# Patient Record
Sex: Female | Born: 1970 | Race: White | Hispanic: No | Marital: Married | Smoking: Current every day smoker
Health system: Southern US, Community
[De-identification: ages and names within clinical notes are randomized; demographics above are authoritative.]

## PROBLEM LIST (undated history)

## (undated) DIAGNOSIS — F419 Anxiety disorder, unspecified: Secondary | ICD-10-CM

## (undated) HISTORY — DX: Anxiety disorder, unspecified: F41.9

## (undated) HISTORY — PX: EUSTACHIAN TUBE DILATION: SHX6770

---

## 1997-12-18 ENCOUNTER — Emergency Department (HOSPITAL_COMMUNITY): Admission: EM | Admit: 1997-12-18 | Discharge: 1997-12-18 | Payer: Self-pay | Admitting: Emergency Medicine

## 1999-01-01 ENCOUNTER — Emergency Department (HOSPITAL_COMMUNITY): Admission: EM | Admit: 1999-01-01 | Discharge: 1999-01-01 | Payer: Self-pay | Admitting: Emergency Medicine

## 1999-01-02 ENCOUNTER — Encounter: Payer: Self-pay | Admitting: Emergency Medicine

## 1999-01-07 ENCOUNTER — Inpatient Hospital Stay (HOSPITAL_COMMUNITY): Admission: AD | Admit: 1999-01-07 | Discharge: 1999-01-07 | Payer: Self-pay | Admitting: Obstetrics

## 1999-01-27 ENCOUNTER — Encounter: Admission: RE | Admit: 1999-01-27 | Discharge: 1999-01-27 | Payer: Self-pay | Admitting: Obstetrics & Gynecology

## 1999-02-10 ENCOUNTER — Encounter: Admission: RE | Admit: 1999-02-10 | Discharge: 1999-02-10 | Payer: Self-pay | Admitting: Obstetrics & Gynecology

## 2002-02-15 ENCOUNTER — Emergency Department (HOSPITAL_COMMUNITY): Admission: EM | Admit: 2002-02-15 | Discharge: 2002-02-15 | Payer: Self-pay | Admitting: Emergency Medicine

## 2002-02-16 ENCOUNTER — Ambulatory Visit (HOSPITAL_COMMUNITY): Admission: RE | Admit: 2002-02-16 | Discharge: 2002-02-16 | Payer: Self-pay | Admitting: Emergency Medicine

## 2002-02-16 ENCOUNTER — Encounter: Payer: Self-pay | Admitting: Emergency Medicine

## 2006-12-26 ENCOUNTER — Encounter: Admission: RE | Admit: 2006-12-26 | Discharge: 2006-12-26 | Payer: Self-pay | Admitting: Family Medicine

## 2010-12-10 ENCOUNTER — Other Ambulatory Visit: Payer: Self-pay | Admitting: Hematology & Oncology

## 2011-02-06 ENCOUNTER — Emergency Department: Payer: Self-pay | Admitting: Emergency Medicine

## 2011-09-29 ENCOUNTER — Telehealth: Payer: Self-pay | Admitting: Family Medicine

## 2011-09-29 NOTE — Telephone Encounter (Signed)
Husband was advised by someone at [facility] office to call for an acute appointment with [facility] today.  Husband described that wife has had symptoms of nausea/vomiting and diarrhea for several days.  Husband stated patient does not have an established PCP and has not been to a doctor in years.  He said that she has been to emergency room in the past when she needed care.  After discussing with one of the doctors at [facility] and a CMA to triage, the patient's husband was advised by the CMA that in order to address his wife's urgent needs that he would need to take her to an urgent care or ER in order for them to assess her in case she is dehydrated and needs I.V. Fluids or other urgent treatment.  The CMA also explained that we would be glad to establish as a new patient and schedule an appointment for her to establish a PCP at this office.  The husband declined and said he would call [facility] back.

## 2011-09-29 NOTE — Telephone Encounter (Signed)
Discussed with Mr. Pietrzak as previously noted by Clarisa Schools.

## 2014-07-19 ENCOUNTER — Emergency Department: Payer: Self-pay | Admitting: Emergency Medicine

## 2014-07-19 LAB — URINALYSIS, COMPLETE
Bilirubin,UR: NEGATIVE
Glucose,UR: NEGATIVE mg/dL (ref 0–75)
Ketone: NEGATIVE
Leukocyte Esterase: NEGATIVE
NITRITE: NEGATIVE
PH: 5 (ref 4.5–8.0)
Protein: 30
RBC,UR: 365 /HPF (ref 0–5)
SPECIFIC GRAVITY: 1.024 (ref 1.003–1.030)
Squamous Epithelial: 6
WBC UR: 1 /HPF (ref 0–5)

## 2014-07-19 LAB — COMPREHENSIVE METABOLIC PANEL
ALK PHOS: 51 U/L
ALT: 16 U/L
ANION GAP: 5 — AB (ref 7–16)
Albumin: 3.6 g/dL (ref 3.4–5.0)
BUN: 14 mg/dL (ref 7–18)
Bilirubin,Total: 0.3 mg/dL (ref 0.2–1.0)
CREATININE: 0.83 mg/dL (ref 0.60–1.30)
Calcium, Total: 8.3 mg/dL — ABNORMAL LOW (ref 8.5–10.1)
Chloride: 110 mmol/L — ABNORMAL HIGH (ref 98–107)
Co2: 23 mmol/L (ref 21–32)
Glucose: 140 mg/dL — ABNORMAL HIGH (ref 65–99)
Osmolality: 278 (ref 275–301)
Potassium: 4.1 mmol/L (ref 3.5–5.1)
SGOT(AST): 14 U/L — ABNORMAL LOW (ref 15–37)
Sodium: 138 mmol/L (ref 136–145)
TOTAL PROTEIN: 6.6 g/dL (ref 6.4–8.2)

## 2014-07-19 LAB — CBC
HCT: 39.1 % (ref 35.0–47.0)
HGB: 12.6 g/dL (ref 12.0–16.0)
MCH: 29.4 pg (ref 26.0–34.0)
MCHC: 32.2 g/dL (ref 32.0–36.0)
MCV: 91 fL (ref 80–100)
Platelet: 164 10*3/uL (ref 150–440)
RBC: 4.27 10*6/uL (ref 3.80–5.20)
RDW: 14.1 % (ref 11.5–14.5)
WBC: 11.4 10*3/uL — ABNORMAL HIGH (ref 3.6–11.0)

## 2014-07-19 LAB — PREGNANCY, URINE: Pregnancy Test, Urine: NEGATIVE m[IU]/mL

## 2014-07-25 ENCOUNTER — Emergency Department: Payer: Self-pay | Admitting: Emergency Medicine

## 2014-07-25 LAB — URINALYSIS, COMPLETE
BILIRUBIN, UR: NEGATIVE
BLOOD: NEGATIVE
Bacteria: NONE SEEN
Glucose,UR: NEGATIVE mg/dL (ref 0–75)
KETONE: NEGATIVE
Leukocyte Esterase: NEGATIVE
Nitrite: NEGATIVE
PH: 7 (ref 4.5–8.0)
Protein: NEGATIVE
Specific Gravity: 1.009 (ref 1.003–1.030)
WBC UR: 3 /HPF (ref 0–5)

## 2014-12-08 ENCOUNTER — Emergency Department
Admission: EM | Admit: 2014-12-08 | Discharge: 2014-12-08 | Payer: Medicaid Other | Attending: Emergency Medicine | Admitting: Emergency Medicine

## 2014-12-08 ENCOUNTER — Encounter: Payer: Self-pay | Admitting: Emergency Medicine

## 2014-12-08 DIAGNOSIS — Z720 Tobacco use: Secondary | ICD-10-CM | POA: Diagnosis not present

## 2014-12-08 DIAGNOSIS — T22131A Burn of first degree of right upper arm, initial encounter: Secondary | ICD-10-CM | POA: Insufficient documentation

## 2014-12-08 DIAGNOSIS — T2016XA Burn of first degree of forehead and cheek, initial encounter: Secondary | ICD-10-CM | POA: Insufficient documentation

## 2014-12-08 DIAGNOSIS — T24122A Burn of first degree of left knee, initial encounter: Secondary | ICD-10-CM | POA: Insufficient documentation

## 2014-12-08 DIAGNOSIS — Y998 Other external cause status: Secondary | ICD-10-CM | POA: Insufficient documentation

## 2014-12-08 DIAGNOSIS — T22031A Burn of unspecified degree of right upper arm, initial encounter: Secondary | ICD-10-CM | POA: Diagnosis present

## 2014-12-08 DIAGNOSIS — Z3202 Encounter for pregnancy test, result negative: Secondary | ICD-10-CM | POA: Diagnosis not present

## 2014-12-08 DIAGNOSIS — T2602XA Burn of left eyelid and periocular area, initial encounter: Secondary | ICD-10-CM | POA: Insufficient documentation

## 2014-12-08 DIAGNOSIS — T23232A Burn of second degree of multiple left fingers (nail), not including thumb, initial encounter: Secondary | ICD-10-CM | POA: Diagnosis not present

## 2014-12-08 DIAGNOSIS — T300 Burn of unspecified body region, unspecified degree: Secondary | ICD-10-CM

## 2014-12-08 DIAGNOSIS — Y9289 Other specified places as the place of occurrence of the external cause: Secondary | ICD-10-CM | POA: Diagnosis not present

## 2014-12-08 DIAGNOSIS — Z23 Encounter for immunization: Secondary | ICD-10-CM | POA: Insufficient documentation

## 2014-12-08 DIAGNOSIS — X088XXA Exposure to other specified smoke, fire and flames, initial encounter: Secondary | ICD-10-CM | POA: Diagnosis not present

## 2014-12-08 DIAGNOSIS — Y9389 Activity, other specified: Secondary | ICD-10-CM | POA: Diagnosis not present

## 2014-12-08 LAB — CBC
HEMATOCRIT: 42.3 % (ref 35.0–47.0)
Hemoglobin: 13.9 g/dL (ref 12.0–16.0)
MCH: 29.6 pg (ref 26.0–34.0)
MCHC: 32.8 g/dL (ref 32.0–36.0)
MCV: 90.1 fL (ref 80.0–100.0)
PLATELETS: 226 10*3/uL (ref 150–440)
RBC: 4.69 MIL/uL (ref 3.80–5.20)
RDW: 13.8 % (ref 11.5–14.5)
WBC: 10.1 10*3/uL (ref 3.6–11.0)

## 2014-12-08 LAB — BASIC METABOLIC PANEL
Anion gap: 8 (ref 5–15)
BUN: 10 mg/dL (ref 6–20)
CO2: 25 mmol/L (ref 22–32)
CREATININE: 0.76 mg/dL (ref 0.44–1.00)
Calcium: 9.4 mg/dL (ref 8.9–10.3)
Chloride: 105 mmol/L (ref 101–111)
Glucose, Bld: 110 mg/dL — ABNORMAL HIGH (ref 65–99)
POTASSIUM: 4.2 mmol/L (ref 3.5–5.1)
SODIUM: 138 mmol/L (ref 135–145)

## 2014-12-08 LAB — POCT PREGNANCY, URINE: PREG TEST UR: NEGATIVE

## 2014-12-08 MED ORDER — HYDROMORPHONE HCL 1 MG/ML IJ SOLN
INTRAMUSCULAR | Status: AC
  Filled 2014-12-08: qty 1

## 2014-12-08 MED ORDER — HYDROMORPHONE HCL 1 MG/ML IJ SOLN
1.0000 mg | INTRAMUSCULAR | Status: AC
  Administered 2014-12-08: 1 mg via INTRAVENOUS

## 2014-12-08 MED ORDER — TETANUS-DIPHTH-ACELL PERTUSSIS 5-2.5-18.5 LF-MCG/0.5 IM SUSP
0.5000 mL | Freq: Once | INTRAMUSCULAR | Status: AC
  Administered 2014-12-08: 0.5 mL via INTRAMUSCULAR

## 2014-12-08 MED ORDER — MORPHINE SULFATE 4 MG/ML IJ SOLN
INTRAMUSCULAR | Status: AC
  Filled 2014-12-08: qty 1

## 2014-12-08 MED ORDER — MORPHINE SULFATE 4 MG/ML IJ SOLN: 4.0000 mg | Freq: Once | INTRAMUSCULAR | Status: DC

## 2014-12-08 MED ORDER — ONDANSETRON HCL 4 MG/2ML IJ SOLN
4.0000 mg | Freq: Once | INTRAMUSCULAR | Status: AC
  Administered 2014-12-08: 4 mg via INTRAVENOUS

## 2014-12-08 MED ORDER — TETANUS-DIPHTH-ACELL PERTUSSIS 5-2.5-18.5 LF-MCG/0.5 IM SUSP
INTRAMUSCULAR | Status: AC
  Administered 2014-12-08: 0.5 mL via INTRAMUSCULAR
  Filled 2014-12-08: qty 0.5

## 2014-12-08 MED ORDER — SODIUM CHLORIDE 0.9 % IV BOLUS (SEPSIS)
1000.0000 mL | Freq: Once | INTRAVENOUS | Status: AC
  Administered 2014-12-08: 1000 mL via INTRAVENOUS

## 2014-12-08 MED ORDER — ONDANSETRON HCL 4 MG/2ML IJ SOLN
INTRAMUSCULAR | Status: AC
  Administered 2014-12-08: 4 mg via INTRAVENOUS
  Filled 2014-12-08: qty 2

## 2014-12-08 MED ORDER — HYDROMORPHONE HCL 1 MG/ML IJ SOLN
1.0000 mg | INTRAMUSCULAR | Status: DC | PRN
  Administered 2014-12-08: 1 mg via INTRAVENOUS

## 2014-12-08 MED ORDER — HYDROMORPHONE HCL 1 MG/ML IJ SOLN
INTRAMUSCULAR | Status: AC
  Administered 2014-12-08: 1 mg via INTRAVENOUS
  Filled 2014-12-08: qty 1
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
## 2014-12-08 NOTE — ED Notes (Signed)
Per EMS, delay in pt transfer, MD aware and approve.

## 2015-03-14 ENCOUNTER — Ambulatory Visit: Payer: Self-pay | Admitting: General Surgery

## 2015-03-24 ENCOUNTER — Ambulatory Visit: Payer: Self-pay | Admitting: General Surgery

## 2016-06-30 IMAGING — CT CT ABD-PELV W/O CM
1 of 4 series · 4 of 46 positions shown, 9 images · non-contrast
Comparison: None.

CLINICAL DATA: Bilateral flank pain.  Pain with urination.

EXAM:
CT ABDOMEN AND PELVIS WITHOUT CONTRAST
TECHNIQUE: Multidetector CT imaging of the abdomen and pelvis was performed
following the standard protocol without IV contrast.

[Series 4: lung windows · axial · 0.58mm/px · z∈[-733,-678]mm · 4 of 19 slices shown, 9 images]
[im 4/19  soft-tissue]
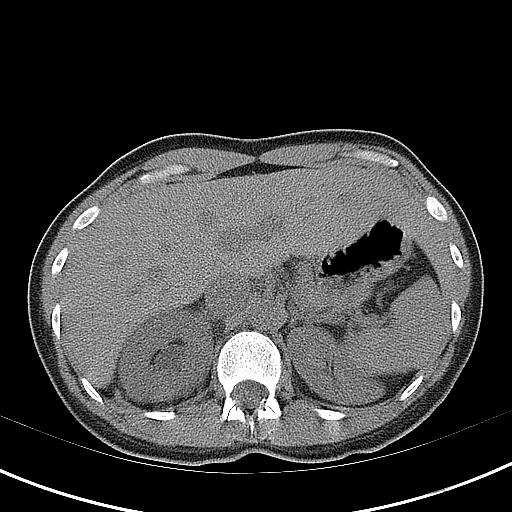
[im 4/19  lung]
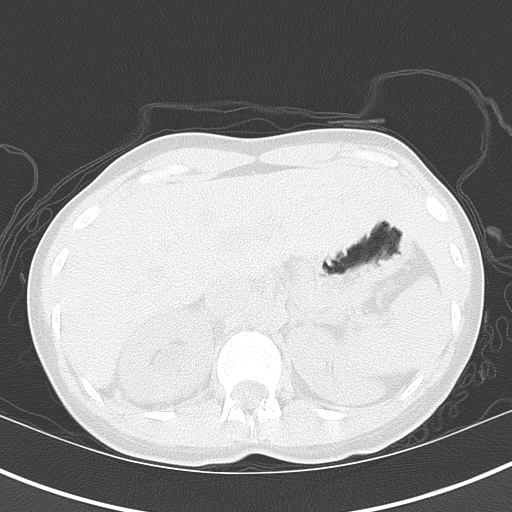
[im 4/19  bone]
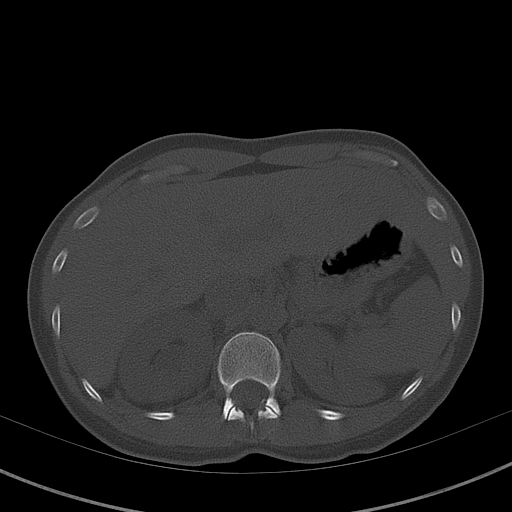
[im 8/19  soft-tissue]
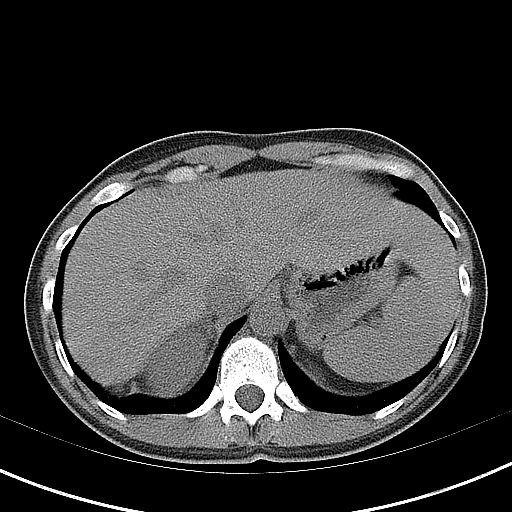
[im 8/19  lung]
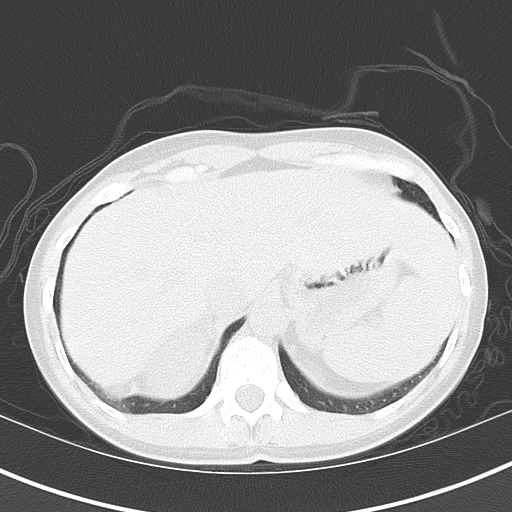
[im 11/19  soft-tissue]
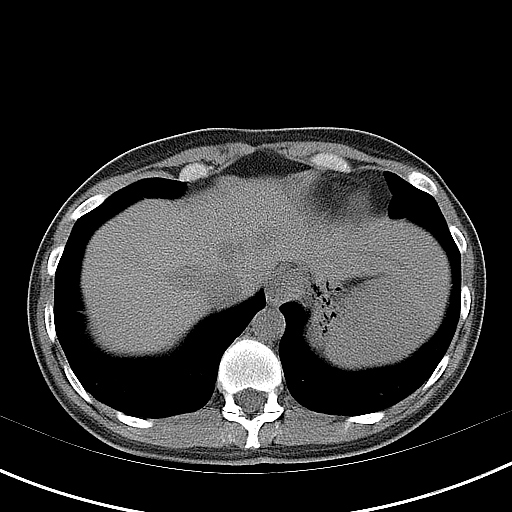
[im 11/19  lung]
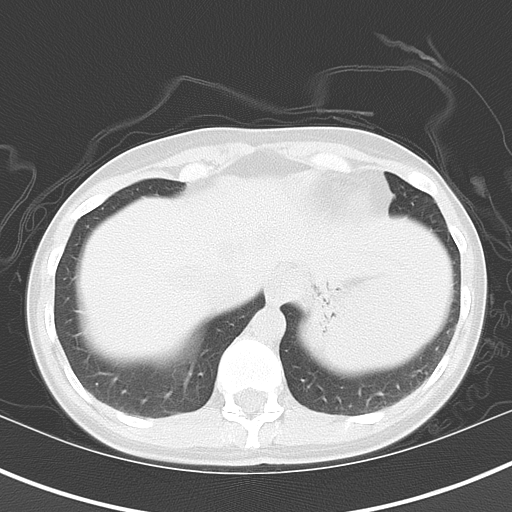
[im 15/19  soft-tissue]
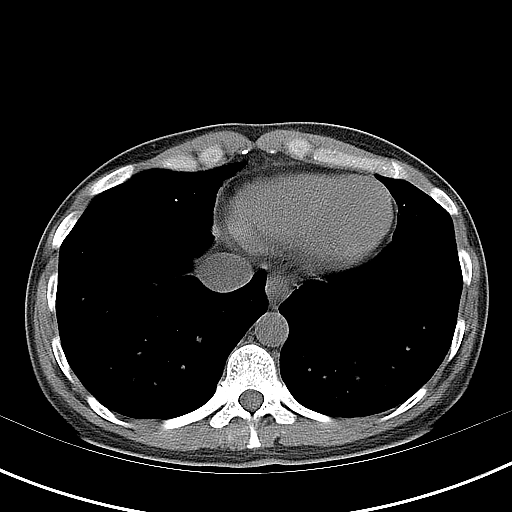
[im 15/19  lung]
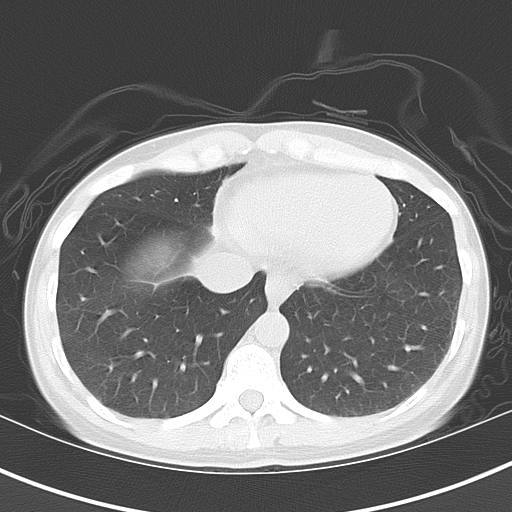

[4 of 46 positions shown; findings below may reference images not displayed]

FINDINGS: The lung bases are clear.

There is are punctate nonobstructing right renal calculi. There is a
2-3 mm distal right ureteral calculus resulting in moderate right
hydroureteronephrosis. No perinephric stranding is seen. The kidneys
are symmetric in size without evidence for exophytic mass. The
bladder is unremarkable.

The liver demonstrates no focal abnormality. The gallbladder is
unremarkable. The spleen demonstrates no focal abnormality. The
adrenal glands and pancreas are normal.

The unopacified stomach, duodenum, small intestine and large
intestine are unremarkable, but evaluation is limited by lack of
oral contrast. There is a normal caliber appendix in the right lower
quadrant without periappendiceal inflammatory changes. There is no
pneumoperitoneum, pneumatosis, or portal venous gas. There is no
abdominal or pelvic free fluid. There is no lymphadenopathy.

The abdominal aorta is normal in caliber .

The osseous structures are unremarkable.
IMPRESSION: 1. 2-3 mm distal right ureteral calculus, just proximal to the UVJ,
resulting in moderate right hydroureteronephrosis.

## 2016-10-25 ENCOUNTER — Emergency Department
Admission: EM | Admit: 2016-10-25 | Discharge: 2016-10-25 | Disposition: A | Discharge status: Home / Self Care | Payer: Self-pay | Attending: Emergency Medicine | Admitting: Emergency Medicine

## 2016-10-25 ENCOUNTER — Encounter: Payer: Self-pay | Admitting: *Deleted

## 2016-10-25 DIAGNOSIS — H73011 Bullous myringitis, right ear: Secondary | ICD-10-CM | POA: Insufficient documentation

## 2016-10-25 DIAGNOSIS — H60501 Unspecified acute noninfective otitis externa, right ear: Secondary | ICD-10-CM | POA: Insufficient documentation

## 2016-10-25 DIAGNOSIS — F17200 Nicotine dependence, unspecified, uncomplicated: Secondary | ICD-10-CM | POA: Insufficient documentation

## 2016-10-25 MED ORDER — OFLOXACIN 0.3 % OP SOLN: 1.0000 [drp] | Freq: Four times a day (QID) | OPHTHALMIC | 0 refills | Status: DC

## 2016-10-25 MED ORDER — AMOXICILLIN 500 MG PO CAPS
500.0000 mg | ORAL_CAPSULE | Freq: Once | ORAL | Status: AC
  Administered 2016-10-25: 500 mg via ORAL
  Filled 2016-10-25: qty 1

## 2016-10-25 MED ORDER — AMOXICILLIN 500 MG PO CAPS: 500.0000 mg | ORAL_CAPSULE | Freq: Three times a day (TID) | ORAL | 0 refills | Status: DC

## 2016-10-25 NOTE — Discharge Instructions (Signed)
Take the prescriptions as directed. Follow-up with Dr. Elenore Rota if symptoms continue. Avoid getting any water in your ear.

## 2016-10-25 NOTE — ED Triage Notes (Signed)
States bilateral ear fullness and drainage for 1 week, pt has been taking advil OTC with no relief

## 2016-10-30 NOTE — ED Provider Notes (Signed)
[facility] Emergency Department Provider Note ____________________________________________  Time seen: 1645  I have reviewed the triage vital signs and the nursing notes.  HISTORY  Chief Complaint  Otalgia  HPI Melanie Larsen is a 46 y.o. female Presents to the ED for evaluation of bilateral ear fullness and drainage for the past week. Patient has been dosing the counter Tylenol and Advil without significant relief. She denies any interim fevers, chills, sweats. She also denies any dizziness, tinnitus, or vertigo. She does note some decreased hearingto the left ear. She describes some purulent drainage from both ears, but noticed some blood-tinged drainage on the left.  History reviewed. No pertinent past medical history.  There are no active problems to display for this patient.  History reviewed. No pertinent surgical history.  Prior to Admission medications   Medication Sig Start Date End Date Taking? Authorizing Provider  amoxicillin (AMOXIL) 500 MG capsule Take 1 capsule (500 mg total) by mouth 3 (three) times daily. 10/25/16   Brian Zeitlin V Bacon Donnae Michels, PA-C  ofloxacin (OCUFLOX) 0.3 % ophthalmic solution Place 1 drop into both ears 4 (four) times daily. 10/25/16   Sunshyne Horvath V Bacon Tymira Horkey, PA-C    Allergies Codeine  History reviewed. No pertinent family history.  Social History Social History  Substance Use Topics  . Smoking status: Current Every Day Smoker  . Smokeless tobacco: Not on file  . Alcohol use No    Review of Systems  Constitutional: Negative for fever. Eyes: Negative for visual changes. ENT: Negative for sore throat. Left greater than right otalgia as above. Cardiovascular: Negative for chest pain. Respiratory: Negative for shortness of breath. Gastrointestinal: Negative for abdominal pain, vomiting and diarrhea. Neurological: Negative for headaches, focal weakness or  numbness. ____________________________________________  PHYSICAL EXAM:  VITAL SIGNS: ED Triage Vitals  Enc Vitals Group     BP 10/25/16 1603 125/67     Pulse Rate 10/25/16 1603 94     Resp 10/25/16 1603 18     Temp 10/25/16 1603 97.8 F (36.6 C)     Temp Source 10/25/16 1603 Oral     SpO2 10/25/16 1603 98 %     Weight 10/25/16 1602 116 lb (52.6 kg)     Height 10/25/16 1602  (1.626 m)     Head Circumference --      Peak Flow --      Pain Score 10/25/16 1601 10     Pain Loc --      Pain Edu? --      Excl. in GC? --     Constitutional: Alert and oriented. Well appearing and in no distress. Head: Normocephalic and atraumatic. Eyes: Conjunctivae are normal. PERRL. Normal extraocular movements Ears: Canals with some purulent drainage bilaterally. TMs intact bilaterally. Left TM with bullae noted and a seropurulent effusion. Right TM poorly visualized due to cerumen in the canal.  Nose: No congestion/rhinorrhea/epistaxis. Mouth/Throat: Mucous membranes are moist. Neck: Supple. No thyromegaly. Hematological/Lymphatic/Immunological: No preauricular lymphadenopathy. Cardiovascular: Normal rate, regular rhythm. Normal distal pulses. Respiratory: Normal respiratory effort. No wheezes/rales/rhonchi. Neurologic:  Normal gait without ataxia. Normal speech and language. No gross focal neurologic deficits are appreciated. Skin:  Skin is warm, dry and intact. No rash noted. Psychiatric: Mood and affect are normal. Patient exhibits appropriate insight and judgment. ____________________________________________  PROCEDURES  Amoxicillin 500 mg PO ____________________________________________  INITIAL IMPRESSION / ASSESSMENT AND PLAN / ED COURSE  Patient with what appears to be in acute otitis externa in the right ear as  well as a bolus pharyngitis of the right ear. Her left ear may also show some maceration the canal which might represent an early otitis externa. She is discharged with  prescriptions for amoxicillin as well as ofloxacin ophthalmic to instill in the ears. She will follow-up with [facility] on the local community clinics for ongoing symptom management. Return precautions are reviewed. ____________________________________________  FINAL CLINICAL IMPRESSION(S) / ED DIAGNOSES  Final diagnoses:  Acute otitis externa of right ear, unspecified type  Bullous myringitis of right ear      Lissa Hoard, PA-C 10/30/16 1744    Phineas Semen, MD 11/01/16 [id number]

## 2021-04-14 ENCOUNTER — Encounter: Payer: Self-pay | Admitting: Certified Nurse Midwife

## 2021-04-14 ENCOUNTER — Ambulatory Visit (INDEPENDENT_AMBULATORY_CARE_PROVIDER_SITE_OTHER): Payer: Self-pay | Admitting: Certified Nurse Midwife

## 2021-04-14 ENCOUNTER — Other Ambulatory Visit: Payer: Self-pay

## 2021-04-14 VITALS — BP 120/80 | HR 66 | Ht 64.0 in | Wt 121.8 lb

## 2021-04-14 DIAGNOSIS — N841 Polyp of cervix uteri: Secondary | ICD-10-CM | POA: Insufficient documentation

## 2021-04-14 NOTE — Patient Instructions (Signed)
Cervical Polyps Cervical polyps are growths that develop in the area between the vagina and the uterus. This area is called the cervix. Cervical polyps are common, especially in women who are older than 40 years and have had children. Cervical polyps are usually pink or purple and about one-half inch in size. What are the causes? The cause of cervical polyps is usually unknown. Possible causes include: Long-term (chronic) inflammation or infection. Poor blood flow through cervical veins. High estrogen levels. What are the signs or symptoms? Cervical polyps do not usually cause any symptoms. They are often found during a routine pelvic exam. If you do have symptoms, they may include: Bleeding from the vagina. Bleeding may happen between menstrual periods or after sex. It may also happen after menopause, when you no longer have a menstrual period. Heavy bleeding during your menstrual period. White or yellow discharge from the vagina (leukorrhea). How is this diagnosed? Your health care provider can diagnose cervical polyps by looking at your cervix during a pelvic exam. How is this treated? Cervical polyps do not need to be treated. However, polyps that cause symptoms may be treated. Treatment includes: Removing the polyps using an instrument called a forceps. This is a painless procedure that does not require anesthesia. Using an electric current (electrocautery) or a chemical called silver nitrate to stop bleeding. After removal, polyps are tested to make sure they are not cancer. They are usually not cancer. They do not require treatment and rarely come back. Follow these instructions at home: Take over-the-counter and prescription medicines only as told by your health care provider. Keep all follow-up visits. This is important. Contact a health care provider if: You have vaginal bleeding between menstrual periods, after sex, or after menopause. You have heavy vaginal bleeding during your  menstrual period. You have white or yellow discharge from the vagina (leukorrhea). You have vaginal bleeding after a cervical polyp removal. Summary Cervical polyps are fingerlike growths that grow in the passage between your vagina and your uterus (cervix). The cause of cervical polyps is usually unknown. Cervical polyps do not usually cause symptoms. They are often found during a routine pelvic exam. Polyps that cause symptoms can be removed during an office visit. Cervical polyps are almost always benign but are checked for cancer after being removed. This information is not intended to replace advice given to you by your health care provider. Make sure you discuss any questions you have with your health care provider. Document Revised: 01/01/2020 Document Reviewed: 01/01/2020 Elsevier Patient Education  2022 [employer]vier Inc[employer]  

## 2021-04-14 NOTE — Progress Notes (Signed)
GYN ENCOUNTER NOTE  Subjective:       Melanie Larsen is a 50 y.o. No obstetric history on file. female is here for gynecologic evaluation of the following issues:  1. "Something protruding out of her vagina". Patient states that her partner noticed tissue protruding from her vagina. She can sometimes feel it when she wipes . It is more prominent  if she has been on her feet all day. It is some times sensitive and will some times have bleeding with wiping. She has avoided intercourse since she has noticed it.    Gynecologic History Patient's last menstrual period was 10/12/2016 (approximate). Contraception: post menopausal status   Obstetric History OB History  No obstetric history on file.    Past Medical History:  Diagnosis Date   Anxiety     Past Surgical History:  Procedure Laterality Date   EUSTACHIAN TUBE DILATION      Current Outpatient Medications on File Prior to Visit  Medication Sig Dispense Refill   amoxicillin (AMOXIL) 500 MG capsule Take 1 capsule (500 mg total) by mouth 3 (three) times daily. 30 capsule 0   ofloxacin (OCUFLOX) 0.3 % ophthalmic solution Place 1 drop into both ears 4 (four) times daily. 5 mL 0   No current facility-administered medications on file prior to visit.    Allergies  Allergen Reactions   Codeine Nausea And Vomiting    Social History   Socioeconomic History   Marital status: Married    Spouse name: Not on file   Number of children: Not on file   Years of education: Not on file   Highest education level: Not on file  Occupational History   Not on file  Tobacco Use   Smoking status: Every Day    Types: Cigarettes   Smokeless tobacco: Never  Vaping Use   Vaping Use: Never used  Substance and Sexual Activity   Alcohol use: No   Drug use: No   Sexual activity: Yes    Birth control/protection: None  Other Topics Concern   Not on file  Social History Narrative   Not on file   Social Determinants of Health   Financial  Resource Strain: Not on file  Food Insecurity: Not on file  Transportation Needs: Not on file  Physical Activity: Not on file  Stress: Not on file  Social Connections: Not on file  Intimate Partner Violence: Not on file    Family History  Problem Relation Age of Onset   Cancer Mother    Cancer Maternal Aunt    Cancer Maternal Grandmother     The following portions of the patient's history were reviewed and updated as appropriate: allergies, current medications, past family history, past medical history, past social history, past surgical history and problem list.  Review of Systems Review of Systems - Negative except as mentioned in HPI Review of Systems - General ROS: negative for - chills, fatigue, fever, hot flashes, malaise or night sweats Hematological and Lymphatic ROS: negative for - bleeding problems or swollen lymph nodes Gastrointestinal ROS: negative for - abdominal pain, blood in stools, change in bowel habits and nausea/vomiting Musculoskeletal ROS: negative for - joint pain, muscle pain or muscular weakness Genito-Urinary ROS: negative for - change in menstrual cycle, dysmenorrhea, dyspareunia, dysuria, genital discharge, genital ulcers, hematuria, incontinence, irregular/heavy menses, nocturia or pelvic pain. Positive for tissue protruding from vagina.   Objective:   BP 120/80   Pulse 66   Ht 5\' 4"  (1.626 m)  Wt 121 lb 12.8 oz (55.2 kg)   LMP 10/12/2016 (Approximate)   BMI 20.91 kg/m  CONSTITUTIONAL: Well-developed, well-nourished female in no acute distress.  HENT:  Normocephalic, atraumatic.  NECK: Normal range of motion, supple, no masses.  Normal thyroid.  SKIN: Skin is warm and dry. No rash noted. Not diaphoretic. No erythema. No pallor. NEUROLGIC: Alert and oriented to person, place, and time. PSYCHIATRIC: Normal mood and affect. Normal behavior. Normal judgment and thought content. CARDIOVASCULAR:Not Examined RESPIRATORY: Not Examined BREASTS: Not  Examined ABDOMEN: Soft, non distended; Non tender.  No Organomegaly. PELVIC:  External Genitalia: Normal  BUS: Normal  Vagina: Normal  Cervix: Normal, large cervical polyp noted 3-4 cm in length, 2-3 cm wide. Dr. Logan Bores consulted  MUSCULOSKELETAL: Normal range of motion. No tenderness.  No cyanosis, clubbing, or edema.   Assessment:   Cervical polyp   Plan:   Given the size of the polyp and potential for bleeding the pt will return with Dr. Logan Bores for removal in the procedure room . She verbalizes and agrees to plan. Follow up prn for polyp removal.   Doreene Burke, CNM

## 2021-05-13 ENCOUNTER — Encounter: Payer: Medicaid Other | Admitting: Obstetrics and Gynecology

## 2021-05-26 ENCOUNTER — Encounter: Payer: Self-pay | Admitting: Obstetrics and Gynecology

## 2021-05-26 ENCOUNTER — Other Ambulatory Visit: Payer: Self-pay

## 2021-05-26 ENCOUNTER — Other Ambulatory Visit (HOSPITAL_COMMUNITY)
Admission: RE | Admit: 2021-05-26 | Discharge: 2021-05-26 | Disposition: A | Discharge status: Home / Self Care | Payer: Self-pay | Source: Ambulatory Visit | Attending: Obstetrics and Gynecology | Admitting: Obstetrics and Gynecology

## 2021-05-26 ENCOUNTER — Ambulatory Visit (INDEPENDENT_AMBULATORY_CARE_PROVIDER_SITE_OTHER): Payer: Self-pay | Admitting: Obstetrics and Gynecology

## 2021-05-26 VITALS — BP 128/81 | HR 60 | Wt 124.7 lb

## 2021-05-26 DIAGNOSIS — N841 Polyp of cervix uteri: Secondary | ICD-10-CM | POA: Insufficient documentation

## 2021-05-26 NOTE — Addendum Note (Signed)
Addended by: Tommie Raymond on: 05/26/2021 04:01 PM   Modules accepted: Orders

## 2021-05-26 NOTE — Progress Notes (Signed)
HPI:      [name] is a 50 y.o. No obstetric history on file. who LMP was Patient's last menstrual period was 10/12/2016 (approximate).  Subjective:   She presents today because she has a very large cervical polyp which she has noted occasionally protruding from the vagina.  She presents today for polypectomy.    Hx: The following portions of the patient's history were reviewed and updated as appropriate:             She  has a past medical history of Anxiety. She does not have any pertinent problems on file. She  has a past surgical history that includes EUSTACHIAN TUBE DILATION. Her family history includes Cancer in her maternal aunt, maternal grandmother, and mother. She  reports that she has been smoking cigarettes. She has never used smokeless tobacco. She reports that she does not drink alcohol and does not use drugs. She has a current medication list which includes the following prescription(s): ofloxacin. She is allergic to codeine.       Review of Systems:  Review of Systems  Constitutional: Denied constitutional symptoms, night sweats, recent illness, fatigue, fever, insomnia and weight loss.  Eyes: Denied eye symptoms, eye pain, photophobia, vision change and visual disturbance.  Ears/Nose/Throat/Neck: Denied ear, nose, throat or neck symptoms, hearing loss, nasal discharge, sinus congestion and sore throat.  Cardiovascular: Denied cardiovascular symptoms, arrhythmia, chest pain/pressure, edema, exercise intolerance, orthopnea and palpitations.  Respiratory: Denied pulmonary symptoms, asthma, pleuritic pain, productive sputum, cough, dyspnea and wheezing.  Gastrointestinal: Denied, gastro-esophageal reflux, melena, nausea and vomiting.  Genitourinary: See HPI for additional information.  Musculoskeletal: Denied musculoskeletal symptoms, stiffness, swelling, muscle weakness and myalgia.  Dermatologic: Denied dermatology symptoms, rash and scar.  Neurologic: Denied  neurology symptoms, dizziness, headache, neck pain and syncope.  Psychiatric: Denied psychiatric symptoms, anxiety and depression.  Endocrine: Denied endocrine symptoms including hot flashes and night sweats.   Meds:   Current Outpatient Medications on File Prior to Visit  Medication Sig Dispense Refill   ofloxacin (OCUFLOX) 0.3 % ophthalmic solution Place 1 drop into both ears 4 (four) times daily. (Patient not taking: Reported on 05/26/2021) 5 mL 0   No current facility-administered medications on file prior to visit.      Objective:     Vitals:   05/26/21 1503  BP: 128/81  Pulse: 60   Filed Weights   05/26/21 1503  Weight: 124 lb 11.2 oz (56.6 kg)              Procedure:  The cervix and polyp was cleaned with a Betadine solution.  The very large endocervical polyp was grasped with a ring forceps and the neck of the polyp was grasped with a long [place] clamp.  Using the Bovie on 50 W coag the neck of the polyp was systematically coagulated and divided.  Hemostasis was noted.  Monsel's solution was applied.  The specimen was sent to pathology.          Assessment:    No obstetric history on file. Patient Active Problem List   Diagnosis Date Noted   Cervical polyp 04/14/2021     1. Cervical polyp        Plan:            1.  Polypectomy performed  2.  Patient to refrain from intercourse for 2 weeks.  3.  Follow-up in 6 weeks.  We will contact her with any significant abnormalities. Orders No orders of the  defined types were placed in this encounter.   No orders of the defined types were placed in this encounter.     F/U  Return in about 6 weeks (around 07/07/2021). I spent 15 additional minutes involved in the care of this patient preparing to see the patient by obtaining and reviewing her medical history (including labs, imaging tests and prior procedures), documenting clinical information in the electronic health record (EHR), counseling and coordinating  care plans, writing and sending prescriptions, ordering tests or procedures and in direct communicating with the patient and medical staff discussing pertinent items from her history and physical exam. Because of the very large size of the polyp it was performed in the procedure room using the Bovie and all of the equipment usually associated with LEEP.  Using this method we were able to avoid taking the patient to the operating room for an outpatient procedure.  Finis Bud, M.D. 05/26/2021 3:41 PM

## 2021-06-08 ENCOUNTER — Telehealth: Payer: Self-pay | Admitting: Obstetrics and Gynecology

## 2021-06-08 NOTE — Telephone Encounter (Signed)
Pt is calling in stating that she thinks that she has another polyp that is very painful and would like to see what Dr. Logan Bores would like to do about it.  Pt would like to have a call back to let her know what she needs to do.

## 2021-06-10 LAB — SURGICAL PATHOLOGY

## 2021-06-11 NOTE — Telephone Encounter (Signed)
Pt was called and scheduled for 06/12/2021

## 2021-06-12 ENCOUNTER — Ambulatory Visit (INDEPENDENT_AMBULATORY_CARE_PROVIDER_SITE_OTHER): Payer: Self-pay | Admitting: Obstetrics and Gynecology

## 2021-06-12 ENCOUNTER — Encounter: Payer: Self-pay | Admitting: Obstetrics and Gynecology

## 2021-06-12 ENCOUNTER — Other Ambulatory Visit: Payer: Self-pay

## 2021-06-12 VITALS — BP 122/51 | HR 76 | Ht 64.0 in | Wt 123.1 lb

## 2021-06-12 DIAGNOSIS — N8189 Other female genital prolapse: Secondary | ICD-10-CM

## 2021-06-12 NOTE — Progress Notes (Signed)
Patient present for abnormal polyps and examination. Patient states she has noticed a new polyp that is white/pink colored. Patient states she has felt friction from her clothing which is causing the growth to be irritated.

## 2021-06-12 NOTE — Progress Notes (Signed)
HPI:      [name] is a 50 y.o. [id number] who LMP was Patient's last menstrual period was 10/12/2016 (approximate).  Subjective:   She presents today stating that she sees a white structure that sometimes comes down near the opening of the vagina.  She is concerned this is a different polyp than the one that was just removed.    Hx: The following portions of the patient's history were reviewed and updated as appropriate:             She  has a past medical history of Anxiety. She does not have any pertinent problems on file. She  has a past surgical history that includes EUSTACHIAN TUBE DILATION. Her family history includes Cancer in her maternal aunt, maternal grandmother, and mother. She  reports that she has been smoking cigarettes. She has never used smokeless tobacco. She reports that she does not drink alcohol and does not use drugs. She has a current medication list which includes the following prescription(s): ofloxacin. She is allergic to codeine.       Review of Systems:  Review of Systems  Constitutional: Denied constitutional symptoms, night sweats, recent illness, fatigue, fever, insomnia and weight loss.  Eyes: Denied eye symptoms, eye pain, photophobia, vision change and visual disturbance.  Ears/Nose/Throat/Neck: Denied ear, nose, throat or neck symptoms, hearing loss, nasal discharge, sinus congestion and sore throat.  Cardiovascular: Denied cardiovascular symptoms, arrhythmia, chest pain/pressure, edema, exercise intolerance, orthopnea and palpitations.  Respiratory: Denied pulmonary symptoms, asthma, pleuritic pain, productive sputum, cough, dyspnea and wheezing.  Gastrointestinal: Denied, gastro-esophageal reflux, melena, nausea and vomiting.  Genitourinary: See HPI for additional information.  Musculoskeletal: Denied musculoskeletal symptoms, stiffness, swelling, muscle weakness and myalgia.  Dermatologic: Denied dermatology symptoms, rash and scar.   Neurologic: Denied neurology symptoms, dizziness, headache, neck pain and syncope.  Psychiatric: Denied psychiatric symptoms, anxiety and depression.  Endocrine: Denied endocrine symptoms including hot flashes and night sweats.   Meds:   Current Outpatient Medications on File Prior to Visit  Medication Sig Dispense Refill   ofloxacin (OCUFLOX) 0.3 % ophthalmic solution Place 1 drop into both ears 4 (four) times daily. (Patient not taking: Reported on 06/12/2021) 5 mL 0   No current facility-administered medications on file prior to visit.      Objective:     Vitals:   06/12/21 0949  BP: (!) 122/51  Pulse: 76   Filed Weights   06/12/21 0949  Weight: 123 lb 1.6 oz (55.8 kg)              Physical examination   Pelvic:   Vulva: Normal appearance.  No lesions.  Vagina: No lesions or abnormalities noted.  Support: Third-degree uterine descensus  Urethra No masses tenderness or scarring.  Meatus Normal size without lesions or prolapse.  Cervix: Normal appearance.  No lesions.  Anus: Normal exam.  No lesions.  Perineum: Normal exam.  No lesions.        Bimanual   Uterus: Normal size.  Non-tender.  Mobile.  AV.  Adnexae: No masses.  Non-tender to palpation.  Cul-de-sac: Negative for abnormality.             Assessment:    [id number] Patient Active Problem List   Diagnosis Date Noted   Cervical polyp 04/14/2021     1. Pelvic relaxation disorder     The "polyp" that she is noticing is actually the anterior lip of the cervix.  She notices this more at the end  of the day when she has been on her feet all day.  Obviously she has pelvic relaxation disorder approximately third degree uterine descensus.   No evidence of endocervical polyp.  XL and endocervix.  Completely normal   Plan:            1.  Expectant management of pelvic relaxation.  Literature given.  Patient to inform us if she has any additional problems from this. Orders No orders of the defined types were  placed in this encounter.   No orders of the defined types were placed in this encounter.     F/U  Return for Annual Physical. I spent 21 minutes involved in the care of this patient preparing to see the patient by obtaining and reviewing her medical history (including labs, imaging tests and prior procedures), documenting clinical information in the electronic health record (EHR), counseling and coordinating care plans, writing and sending prescriptions, ordering tests or procedures and in direct communicating with the patient and medical staff discussing pertinent items from her history and physical exam.  Finis Bud, M.D. 06/12/2021 10:17 AM

## 2021-07-07 ENCOUNTER — Encounter: Payer: Medicaid Other | Admitting: Obstetrics and Gynecology

## 2021-07-23 ENCOUNTER — Encounter: Payer: Medicaid Other | Admitting: Obstetrics and Gynecology

## 2023-05-26 ENCOUNTER — Ambulatory Visit (INDEPENDENT_AMBULATORY_CARE_PROVIDER_SITE_OTHER): Payer: BC Managed Care – PPO | Admitting: Plastic Surgery

## 2023-05-26 ENCOUNTER — Encounter: Payer: Self-pay | Admitting: Plastic Surgery

## 2023-05-26 VITALS — BP 144/77 | HR 76

## 2023-05-26 DIAGNOSIS — R221 Localized swelling, mass and lump, neck: Secondary | ICD-10-CM

## 2023-05-26 DIAGNOSIS — D489 Neoplasm of uncertain behavior, unspecified: Secondary | ICD-10-CM

## 2023-05-26 NOTE — Progress Notes (Signed)
   Referring Provider No referring provider defined for this encounter.   CC:  Chief Complaint  Patient presents with   Skin Problem      Melanie Larsen is an 52 y.o. female.  HPI: Melanie Larsen is a 52 year old female who presents today with complaints of a cyst on the left neck just below the mandibular margin that has been present for approximately 2 years.  She does not have any pain but does not like the appearance of the mass.  She would like to have it removed  Allergies  Allergen Reactions   Codeine Nausea And Vomiting    Outpatient Encounter Medications as of 05/26/2023  Medication Sig   ofloxacin (OCUFLOX) 0.3 % ophthalmic solution Place 1 drop into both ears 4 (four) times daily. (Patient not taking: Reported on 06/12/2021)   No facility-administered encounter medications on file as of 05/26/2023.     Past Medical History:  Diagnosis Date   Anxiety     Past Surgical History:  Procedure Laterality Date   EUSTACHIAN TUBE DILATION      Family History  Problem Relation Age of Onset   Cancer Mother    Cancer Maternal Aunt    Cancer Maternal Grandmother     Social History   Social History Narrative   Not on file     Review of Systems General: Denies fevers, chills, weight loss CV: Denies chest pain, shortness of breath, palpitations Skin: Small nontender mass on the left side of the neck  Physical Exam    05/26/2023   10:24 AM 06/12/2021    9:49 AM 05/26/2021    3:03 PM  Vitals with BMI  Height  5\' 4"    Weight  123 lbs 2 oz 124 lbs 11 oz  BMI  21.12   Systolic 144 122 161  Diastolic 77 51 81  Pulse 76 76 60    General:  No acute distress,  Alert and oriented, Non-Toxic, Normal speech and affect Integument: There is a 5 to 7 mm firm mass on the lateral portion of the left neck just below the mandibular margin.  It is not fixed to any underlying structures and is not tender to palpation.  There is what appears to be a punctum on the center of the  mass. Mammogram: Not applicable to this visit but no mammogram recorded since 2008 Assessment/Plan Mass, left neck: Patient has a small mass which is consistent with an epidermal inclusion cyst.  I believe this can be removed in the office under local.  I discussed with the patient who is in agreement.  She understands that there will be a small scar and that there is a risk of recurrence.  All questions were answered to her satisfaction.  Photographs were obtained today with her consent.  Will schedule her for excision of the mass under local anesthesia in the office.  Melanie Larsen 05/26/2023, 10:42 AM

## 2023-06-23 ENCOUNTER — Ambulatory Visit: Payer: BC Managed Care – PPO | Admitting: Plastic Surgery

## 2023-06-23 VITALS — BP 121/64 | HR 87

## 2023-06-23 DIAGNOSIS — D489 Neoplasm of uncertain behavior, unspecified: Secondary | ICD-10-CM

## 2023-06-23 DIAGNOSIS — L720 Epidermal cyst: Secondary | ICD-10-CM | POA: Diagnosis not present

## 2023-06-23 NOTE — Progress Notes (Signed)
Procedure Note  Preoperative Dx: Left neck cyst  Postoperative Dx: Same  Procedure: Excision of left neck cyst  Anesthesia: Lidocaine 1% with 1:100,000 epinephrine and 0.25% Sensorcaine   Indication for Procedure: Removal of tissue for pathologic diagnosis  Description of Procedure: Risks and complications were explained to the patient including the risks of infection and recurrence.  Consent was confirmed and the patient understands the risks and benefits.  The potential complications and alternatives were explained and the patient consents.  The patient expressed understanding the option of not having the procedure and the risks of a scar.  Time out was called and all information was confirmed to be correct.    The area was prepped and drapped.  Local anesthetic was injected in the subcutaneous tissues.  After waiting for the local to take affect an elliptical incision was made around the opening to the cyst.  Combination of sharp and blunt dissection were used to completely remove the cyst wall and its contents.  The remaining wound was irrigated with peroxide.  After obtaining hemostasis, the surgical wound was closed opted 5-0 Prolene sutures.  The surgical wound measured 2 cm.  A dressing was applied.  The patient was given instructions on how to care for the area and a follow up appointment.  Karlina tolerated the procedure well and there were no complications. The specimen was sent to pathology.

## 2023-06-23 NOTE — Addendum Note (Signed)
Addended by: Angelita Ingles on: 06/23/2023 02:45 PM   Modules accepted: Orders

## 2023-06-30 ENCOUNTER — Encounter: Payer: Self-pay | Admitting: Physician Assistant

## 2023-06-30 ENCOUNTER — Ambulatory Visit: Payer: BC Managed Care – PPO | Admitting: Physician Assistant

## 2023-06-30 VITALS — BP 126/89 | HR 78

## 2023-06-30 DIAGNOSIS — Z9889 Other specified postprocedural states: Secondary | ICD-10-CM

## 2023-06-30 DIAGNOSIS — D489 Neoplasm of uncertain behavior, unspecified: Secondary | ICD-10-CM

## 2023-06-30 LAB — DERMATOLOGY PATHOLOGY

## 2023-06-30 NOTE — Progress Notes (Signed)
Patient is a pleasant 52 year old female with history of cystic mass left neck s/p excision performed 06/23/2023 by Dr. Ladona Ridgel who presents to clinic for postprocedural follow-up.  Reviewed procedural note and the wound was closed with 5-0 Prolene sutures.  Pathology is still active and has not yet resulted.  Today, patient is doing well from a postprocedural standpoint.  She states the area is mildly itchy and she is eager to have the sutures removed.  Otherwise, no complications or difficulties.  She is pleased with the outcome.  Excision site appears to be well-approximated and healing nicely.  All 4 of the Prolene interrupted sutures are removed without complication or difficulty.  Well-tolerated by patient.  No surrounding erythema or induration otherwise concerning for infection.  Recommending Vaseline at the excision site for the next 5 days and then transition to silicone scar gels and gentle mechanical massage twice daily x 3 months.  Follow-up only as needed.  She understands to call the office should she have any questions or concerns about her pathology report once it results and we would be happy to discuss.

## 2024-07-17 ENCOUNTER — Emergency Department

## 2024-07-17 ENCOUNTER — Other Ambulatory Visit: Payer: Self-pay

## 2024-07-17 ENCOUNTER — Emergency Department
Admission: EM | Admit: 2024-07-17 | Discharge: 2024-07-17 | Disposition: A | Discharge status: Home / Self Care | Attending: Emergency Medicine | Admitting: Emergency Medicine

## 2024-07-17 ENCOUNTER — Encounter: Payer: Self-pay | Admitting: Emergency Medicine

## 2024-07-17 DIAGNOSIS — R1032 Left lower quadrant pain: Secondary | ICD-10-CM | POA: Diagnosis present

## 2024-07-17 DIAGNOSIS — D72829 Elevated white blood cell count, unspecified: Secondary | ICD-10-CM | POA: Diagnosis not present

## 2024-07-17 DIAGNOSIS — K5732 Diverticulitis of large intestine without perforation or abscess without bleeding: Secondary | ICD-10-CM | POA: Insufficient documentation

## 2024-07-17 DIAGNOSIS — K5792 Diverticulitis of intestine, part unspecified, without perforation or abscess without bleeding: Secondary | ICD-10-CM

## 2024-07-17 LAB — COMPREHENSIVE METABOLIC PANEL WITH GFR
ALT: 13 U/L (ref 0–44)
AST: 18 U/L (ref 15–41)
Albumin: 4.4 g/dL (ref 3.5–5.0)
Alkaline Phosphatase: 91 U/L (ref 38–126)
Anion gap: 10 (ref 5–15)
BUN: 10 mg/dL (ref 6–20)
CO2: 24 mmol/L (ref 22–32)
Calcium: 10 mg/dL (ref 8.9–10.3)
Chloride: 103 mmol/L (ref 98–111)
Creatinine, Ser: 0.64 mg/dL (ref 0.44–1.00)
GFR, Estimated: >60 mL/min
Glucose, Bld: 120 mg/dL — ABNORMAL HIGH (ref 70–99)
Potassium: 4.3 mmol/L (ref 3.5–5.1)
Sodium: 137 mmol/L (ref 135–145)
Total Bilirubin: 0.5 mg/dL (ref 0.0–1.2)
Total Protein: 7.4 g/dL (ref 6.5–8.1)

## 2024-07-17 LAB — URINALYSIS, ROUTINE W REFLEX MICROSCOPIC
Bacteria, UA: NONE SEEN
Bilirubin Urine: NEGATIVE
Glucose, UA: NEGATIVE mg/dL
Ketones, ur: NEGATIVE mg/dL
Leukocytes,Ua: NEGATIVE
Nitrite: NEGATIVE
Protein, ur: NEGATIVE mg/dL
Specific Gravity, Urine: 1.018 (ref 1.005–1.030)
pH: 6 (ref 5.0–8.0)

## 2024-07-17 LAB — CBC
HCT: 42.7 % (ref 36.0–46.0)
Hemoglobin: 14.5 g/dL (ref 12.0–15.0)
MCH: 29.2 pg (ref 26.0–34.0)
MCHC: 34 g/dL (ref 30.0–36.0)
MCV: 86.1 fL (ref 80.0–100.0)
Platelets: 205 K/uL (ref 150–400)
RBC: 4.96 MIL/uL (ref 3.87–5.11)
RDW: 13.2 % (ref 11.5–15.5)
WBC: 12.6 K/uL — ABNORMAL HIGH (ref 4.0–10.5)
nRBC: 0 % (ref 0.0–0.2)

## 2024-07-17 LAB — LIPASE, BLOOD: Lipase: 30 U/L (ref 11–51)

## 2024-07-17 MED ORDER — ONDANSETRON 4 MG PO TBDP: 4.0000 mg | ORAL_TABLET | Freq: Three times a day (TID) | ORAL | 0 refills | Status: AC | PRN

## 2024-07-17 MED ORDER — IOHEXOL 300 MG/ML  SOLN
100.0000 mL | Freq: Once | INTRAMUSCULAR | Status: AC | PRN
  Administered 2024-07-17: 100 mL via INTRAVENOUS

## 2024-07-17 MED ORDER — LACTATED RINGERS IV BOLUS
1000.0000 mL | Freq: Once | INTRAVENOUS | Status: AC
  Administered 2024-07-17: 1000 mL via INTRAVENOUS

## 2024-07-17 MED ORDER — AMOXICILLIN-POT CLAVULANATE 875-125 MG PO TABS: 1.0000 | ORAL_TABLET | Freq: Two times a day (BID) | ORAL | 0 refills | Status: AC

## 2024-07-17 NOTE — ED Triage Notes (Signed)
 Pt reports left side groin pain that started Friday night. Pt reports vomiting and nausea. Denies fevers.

## 2024-07-17 NOTE — ED Notes (Signed)
 AVS with prescriptions provided to and discussed with patient and family member at bedside. Pt verbalizes understanding of discharge instructions and denies any questions or concerns at this time. Pt has ride home. Pt ambulated out of department independently with steady gait.

## 2024-07-17 NOTE — ED Provider Notes (Signed)
 "  [facility] Provider Note    Event Date/Time   First MD Initiated Contact with Patient 07/17/24 0730     (approximate)   History   Chief Complaint Groin Pain   HPI  Melanie Larsen is a 54 y.o. female with no significant past medical history who presents to the ED complaining of groin pain.  Patient reports that she has been dealing with waxing waning pain in the left lower quadrant of her abdomen extending into her left groin for the past 2 days.  She describes the pain as similar to a toothache, worse with movement and making it difficult for her to sleep the past couple of nights.  She has also had some nausea and vomiting, but denies any changes in her bowel movements.  She has not had any fevers, dysuria, or flank pain.  She does describe similar symptoms with ovarian cysts and kidney stones in the past, denies prior abdominal surgeries outside of tubal ligation.  She also reports being postmenopausal.     Physical Exam   Triage Vital Signs: ED Triage Vitals [07/17/24 0709]  Encounter Vitals Group     BP 136/64     Girls Systolic BP Percentile      Girls Diastolic BP Percentile      Boys Systolic BP Percentile      Boys Diastolic BP Percentile      Pulse Rate 95     Resp 17     Temp 97.9 F (36.6 C)     Temp Source Oral     SpO2 95 %     Weight      Height      Head Circumference      Peak Flow      Pain Score 7     Pain Loc      Pain Education      Exclude from Growth Chart     Most recent vital signs: Vitals:   07/17/24 0709  BP: 136/64  Pulse: 95  Resp: 17  Temp: 97.9 F (36.6 C)  SpO2: 95%    Constitutional: Alert and oriented. Eyes: Conjunctivae are normal. Head: Atraumatic. Nose: No congestion/rhinnorhea. Mouth/Throat: Mucous membranes are moist.  Cardiovascular: Normal rate, regular rhythm. Grossly normal heart sounds.  2+ radial pulses bilaterally. Respiratory: Normal respiratory effort.  No retractions. Lungs  CTAB. Gastrointestinal: Soft and tender to palpation in the left lower quadrant with no rebound or guarding.  No CVA tenderness bilaterally. No distention. Musculoskeletal: No lower extremity tenderness nor edema.  Neurologic:  Normal speech and language. No gross focal neurologic deficits are appreciated.    ED Results / Procedures / Treatments   Labs (all labs ordered are listed, but only abnormal results are displayed) Labs Reviewed  COMPREHENSIVE METABOLIC PANEL WITH GFR - Abnormal; Notable for the following components:      Result Value   Glucose, Bld 120 (*)    All other components within normal limits  CBC - Abnormal; Notable for the following components:   WBC 12.6 (*)    All other components within normal limits  URINALYSIS, ROUTINE W REFLEX MICROSCOPIC - Abnormal; Notable for the following components:   Color, Urine YELLOW (*)    APPearance HAZY (*)    Hgb urine dipstick SMALL (*)    All other components within normal limits  LIPASE, BLOOD    RADIOLOGY CT abdomen/pelvis reviewed and interpreted by me with inflammatory changes in the left lower quadrant consistent with  diverticulitis, no fluid collections noted.  PROCEDURES:  Critical Care performed: No  Procedures   MEDICATIONS ORDERED IN ED: Medications  lactated ringers  bolus 1,000 mL (1,000 mLs Intravenous New Bag/Given 07/17/24 0819)  iohexol  (OMNIPAQUE ) 300 MG/ML solution 100 mL (100 mLs Intravenous Contrast Given 07/17/24 0837)     IMPRESSION / MDM / ASSESSMENT AND PLAN / ED COURSE  I reviewed the triage vital signs and the nursing notes.                              54 y.o. female with no significant past medical history who presents to the ED complaining of waxing waning left lower quadrant abdominal pain over the past 2 days extending into her left groin.  Patient's presentation is most consistent with acute presentation with potential threat to life or bodily function.  Differential diagnosis  includes, but is not limited to, diverticulitis, ovarian cyst, kidney stone, UTI, colitis.  Patient well-appearing and in no acute distress, vital signs are unremarkable.  Her abdomen is soft but she does have some tenderness in the left lower quadrant, will further assess with CT imaging.  Labs and urinalysis are pending at this time, will hydrate with IV fluids but patient declines pain or nausea medication.  CT imaging consistent with acute uncomplicated diverticulitis.  Labs show mild leukocytosis but no significant anemia, electrolyte abnormality, or AKI.  LFTs and lipase are unremarkable, urinalysis with no signs of infection.  Patient feels well on reassessment and is appropriate for outpatient management, will prescribe Augmentin  and Zofran .  She was counseled to establish care with PCP and to return to the ED for new or worsening symptoms, patient agrees with plan.      FINAL CLINICAL IMPRESSION(S) / ED DIAGNOSES   Final diagnoses:  Diverticulitis     Rx / DC Orders   ED Discharge Orders          Ordered    amoxicillin -clavulanate (AUGMENTIN ) 875-125 MG tablet  2 times daily        07/17/24 0924    ondansetron  (ZOFRAN -ODT) 4 MG disintegrating tablet  Every 8 hours PRN        07/17/24 9075             Note:  This document was prepared using Dragon voice recognition software and may include unintentional dictation errors.   [name], MD 07/17/24 [phone]  "
